# Patient Record
Sex: Female | Born: 2000 | Race: Black or African American | Hispanic: No | Marital: Single | Smoking: Never smoker
Health system: Southern US, Community
[De-identification: ages and names within clinical notes are randomized; demographics above are authoritative.]

## PROBLEM LIST (undated history)

## (undated) ENCOUNTER — Emergency Department (HOSPITAL_COMMUNITY): Admission: EM | Payer: Medicaid Other | Source: Home / Self Care

## (undated) DIAGNOSIS — F32A Depression, unspecified: Secondary | ICD-10-CM

## (undated) DIAGNOSIS — F329 Major depressive disorder, single episode, unspecified: Secondary | ICD-10-CM

## (undated) DIAGNOSIS — N390 Urinary tract infection, site not specified: Secondary | ICD-10-CM

## (undated) DIAGNOSIS — F909 Attention-deficit hyperactivity disorder, unspecified type: Secondary | ICD-10-CM

## (undated) DIAGNOSIS — E669 Obesity, unspecified: Secondary | ICD-10-CM

## (undated) DIAGNOSIS — H539 Unspecified visual disturbance: Secondary | ICD-10-CM

## (undated) DIAGNOSIS — R519 Headache, unspecified: Secondary | ICD-10-CM

## (undated) DIAGNOSIS — T7840XA Allergy, unspecified, initial encounter: Secondary | ICD-10-CM

## (undated) DIAGNOSIS — R51 Headache: Secondary | ICD-10-CM

## (undated) DIAGNOSIS — F419 Anxiety disorder, unspecified: Secondary | ICD-10-CM

---
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
## 2019-08-07 ENCOUNTER — Other Ambulatory Visit: Payer: Self-pay

## 2019-08-07 ENCOUNTER — Encounter (HOSPITAL_COMMUNITY): Payer: Self-pay

## 2019-08-07 ENCOUNTER — Emergency Department (HOSPITAL_COMMUNITY)
Admission: EM | Admit: 2019-08-07 | Discharge: 2019-08-07 | Disposition: A | Discharge status: Home / Self Care | Payer: Medicaid Other | Attending: Emergency Medicine | Admitting: Emergency Medicine

## 2019-08-07 DIAGNOSIS — L0591 Pilonidal cyst without abscess: Secondary | ICD-10-CM | POA: Diagnosis not present

## 2019-08-07 DIAGNOSIS — M533 Sacrococcygeal disorders, not elsewhere classified: Secondary | ICD-10-CM | POA: Diagnosis present

## 2019-08-07 NOTE — Discharge Instructions (Addendum)
Use circular pillows to take the weight off your tailbone.  Use Tylenol and ibuprofen as needed for pain. Follow-up with general surgeon for further treatment options.  Return for fevers, spreading redness or new concerns.

## 2019-08-07 NOTE — ED Provider Notes (Signed)
[facility] EMERGENCY DEPARTMENT Provider Note   CSN: [id number] Arrival date & time: 08/07/19  1342     History Chief Complaint  Patient presents with  . Tailbone Pain    Melissa Reilly is a 19 y.o. female.  Patient with history of depression and anxiety presents with upper buttocks pain in the middle from most 1 week.  No history of similar.  No history of abscesses.  No fevers chills abdominal pain or vomiting.  No injuries.        Past Medical History:  Diagnosis Date  . ADHD (attention deficit hyperactivity disorder)   . Allergy   . Anxiety   . Depression   . Headache   . Obesity   . Urinary tract infection   . Vision abnormalities    wears glasses    Patient Active Problem List   Diagnosis Date Noted  . 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 Suicide attempt by hanging (HCC)   . MDD (major depressive disorder), recurrent severe, without psychosis (HCC) 06/06/2016  . Suicidal ideation 06/06/2016  . MDD (major depressive disorder) 06/05/2016    History reviewed. No pertinent surgical history.   OB History    Gravida  1   Para      Term      Preterm      AB      Living        SAB      TAB      Ectopic      Multiple      Live Births              History reviewed. No pertinent family history.  Social History   Tobacco Use  . Smoking status: Never Smoker  . Smokeless tobacco: Never Used  Vaping Use  . Vaping Use: Never used  Substance Use Topics  . Alcohol use: No  . Drug use: Yes    Types: Marijuana    Home Medications Prior to Admission medications   Medication Sig Start Date End Date Taking? Authorizing Provider  ARIPiprazole (ABILIFY) 15 MG tablet Take 1 tablet (15 mg total) by mouth at bedtime. Patient not taking: Reported on 10/01/2018 12/17/16   Denzil Magnuson, NP  sertraline (ZOLOFT) 100 MG tablet Take 1 tablet (100 mg total) by mouth daily. Patient  not taking: Reported on 10/01/2018 12/18/16   Denzil Magnuson, NP    Allergies    Other, Soap, and Risperidone and related  Review of Systems   Review of Systems  Constitutional: Negative for chills and fever.  Cardiovascular: Negative for chest pain.  Gastrointestinal: Negative for abdominal pain and vomiting.  Genitourinary: Negative for dysuria and flank pain.  Musculoskeletal: Negative for back pain, neck pain and neck stiffness.  Skin: Negative for rash.  Neurological: Negative for weakness, light-headedness, numbness and headaches.    Physical Exam Updated Vital Signs BP 111/77   Pulse 85   Temp 98.3 F (36.8 C) (Oral)   Resp 16   Wt 79.8 kg   SpO2 99%   Physical Exam Vitals and nursing note reviewed.  Constitutional:      Appearance: She is well-developed.  HENT:     Head: Normocephalic and atraumatic.  Eyes:     General:        Right eye: No discharge.        Left eye: No discharge.  Neck:     Trachea: No tracheal deviation.  Cardiovascular:     Rate and Rhythm: Normal rate.  Pulmonary:     Effort: Pulmonary effort is normal.  Abdominal:     General: There is no distension.     Palpations: Abdomen is soft.  Musculoskeletal:        General: Tenderness present.     Cervical back: Normal range of motion.  Skin:    General: Skin is warm.     Findings: No rash.     Comments: Patient has tenderness midline upper gluteal cleft, no external signs of infection, no erythema, no warmth, no edema, mild tenderness to palpation midline.  No fluctuance gluteal region.  Neurological:     Mental Status: She is alert and oriented to person, place, and time.     ED Results / Procedures / Treatments   Labs (all labs ordered are listed, but only abnormal results are displayed) Labs Reviewed - No data to display  EKG None  Radiology No results found.  Procedures Procedures (including critical care time)  Medications Ordered in ED Medications - No data to  display  ED Course  I have reviewed the triage vital signs and the nursing notes.  Pertinent labs & imaging results that were available during my care of the patient were reviewed by me and considered in my medical decision making (see chart for details).    MDM Rules/Calculators/A&P                          Patient presents with clinical concern for pilonidal cyst.  Mild tenderness on exam.  No external signs of infection no fever, well-appearing otherwise.  Discussed supportive care and follow-up with general surgery.  Final Clinical Impression(s) / ED Diagnoses Final diagnoses:  Pilonidal cyst    Rx / DC Orders ED Discharge Orders    None       Blane Ohara, MD 08/07/19 1714

## 2019-08-07 NOTE — ED Triage Notes (Signed)
Pt presents to ED via [facility] with tailbone pain x1 week. Pt denies any injury

## 2019-08-17 ENCOUNTER — Emergency Department (HOSPITAL_COMMUNITY)
Admission: EM | Admit: 2019-08-17 | Discharge: 2019-08-18 | Disposition: A | Discharge status: Home / Self Care | Payer: Medicaid Other | Attending: Emergency Medicine | Admitting: Emergency Medicine

## 2019-08-17 ENCOUNTER — Other Ambulatory Visit: Payer: Self-pay

## 2019-08-17 ENCOUNTER — Encounter (HOSPITAL_COMMUNITY): Payer: Self-pay | Admitting: Emergency Medicine

## 2019-08-17 DIAGNOSIS — Z5321 Procedure and treatment not carried out due to patient leaving prior to being seen by health care provider: Secondary | ICD-10-CM | POA: Insufficient documentation

## 2019-08-17 DIAGNOSIS — R0981 Nasal congestion: Secondary | ICD-10-CM | POA: Diagnosis present

## 2019-08-17 DIAGNOSIS — J0100 Acute maxillary sinusitis, unspecified: Secondary | ICD-10-CM | POA: Insufficient documentation

## 2019-08-17 NOTE — ED Triage Notes (Signed)
Patient arrived with EMS from home reports nasal congestion/maxillary sinus pressure , denies cough , no fever or chills .

## 2019-08-18 NOTE — ED Notes (Signed)
Pt told the risk of leaving. Pt said the wait was too long. Pt walk out cussing.

## 2020-03-31 ENCOUNTER — Encounter (HOSPITAL_COMMUNITY): Payer: Self-pay | Admitting: Emergency Medicine

## 2020-03-31 ENCOUNTER — Emergency Department (HOSPITAL_COMMUNITY)
Admission: EM | Admit: 2020-03-31 | Discharge: 2020-03-31 | Disposition: A | Discharge status: Home / Self Care | Payer: Medicaid Other | Attending: Emergency Medicine | Admitting: Emergency Medicine

## 2020-03-31 DIAGNOSIS — F419 Anxiety disorder, unspecified: Secondary | ICD-10-CM | POA: Diagnosis not present

## 2020-03-31 DIAGNOSIS — Z23 Encounter for immunization: Secondary | ICD-10-CM | POA: Insufficient documentation

## 2020-03-31 DIAGNOSIS — S61412A Laceration without foreign body of left hand, initial encounter: Secondary | ICD-10-CM | POA: Diagnosis not present

## 2020-03-31 DIAGNOSIS — Z20822 Contact with and (suspected) exposure to covid-19: Secondary | ICD-10-CM | POA: Diagnosis not present

## 2020-03-31 DIAGNOSIS — S61411A Laceration without foreign body of right hand, initial encounter: Secondary | ICD-10-CM

## 2020-03-31 DIAGNOSIS — X789XXA Intentional self-harm by unspecified sharp object, initial encounter: Secondary | ICD-10-CM | POA: Diagnosis not present

## 2020-03-31 DIAGNOSIS — R4689 Other symptoms and signs involving appearance and behavior: Secondary | ICD-10-CM

## 2020-03-31 DIAGNOSIS — S6992XA Unspecified injury of left wrist, hand and finger(s), initial encounter: Secondary | ICD-10-CM | POA: Diagnosis present

## 2020-03-31 LAB — CBC WITH DIFFERENTIAL/PLATELET
Abs Immature Granulocytes: 0.02 10*3/uL (ref 0.00–0.07)
Basophils Absolute: 0 10*3/uL (ref 0.0–0.1)
Basophils Relative: 0 %
Eosinophils Absolute: 0 10*3/uL (ref 0.0–0.5)
Eosinophils Relative: 0 %
HCT: 39.3 % (ref 36.0–46.0)
Hemoglobin: 12.6 g/dL (ref 12.0–15.0)
Immature Granulocytes: 0 %
Lymphocytes Relative: 37 %
Lymphs Abs: 2.6 10*3/uL (ref 0.7–4.0)
MCH: 28.8 pg (ref 26.0–34.0)
MCHC: 32.1 g/dL (ref 30.0–36.0)
MCV: 89.9 fL (ref 80.0–100.0)
Monocytes Absolute: 0.2 10*3/uL (ref 0.1–1.0)
Monocytes Relative: 3 %
Neutro Abs: 4.2 10*3/uL (ref 1.7–7.7)
Neutrophils Relative %: 60 %
Platelets: 285 10*3/uL (ref 150–400)
RBC: 4.37 MIL/uL (ref 3.87–5.11)
RDW: 13.9 % (ref 11.5–15.5)
WBC: 7.1 10*3/uL (ref 4.0–10.5)
nRBC: 0 % (ref 0.0–0.2)

## 2020-03-31 LAB — BASIC METABOLIC PANEL
Anion gap: 10 (ref 5–15)
BUN: 13 mg/dL (ref 6–20)
CO2: 23 mmol/L (ref 22–32)
Calcium: 10 mg/dL (ref 8.9–10.3)
Chloride: 108 mmol/L (ref 98–111)
Creatinine, Ser: 0.95 mg/dL (ref 0.44–1.00)
GFR, Estimated: >60 mL/min (ref >60)
Glucose, Bld: 79 mg/dL (ref 70–99)
Potassium: 4.1 mmol/L (ref 3.5–5.1)
Sodium: 141 mmol/L (ref 135–145)

## 2020-03-31 LAB — I-STAT BETA HCG BLOOD, ED (MC, WL, AP ONLY): I-stat hCG, quantitative: <5 m[IU]/mL (ref <5)

## 2020-03-31 LAB — RESP PANEL BY RT-PCR (FLU A&B, COVID) ARPGX2
Influenza A by PCR: NEGATIVE
Influenza B by PCR: NEGATIVE
SARS Coronavirus 2 by RT PCR: NEGATIVE

## 2020-03-31 MED ORDER — TETANUS-DIPHTH-ACELL PERTUSSIS 5-2.5-18.5 LF-MCG/0.5 IM SUSY
0.5000 mL | PREFILLED_SYRINGE | Freq: Once | INTRAMUSCULAR | Status: AC
  Administered 2020-03-31: 0.5 mL via INTRAMUSCULAR
  Filled 2020-03-31: qty 0.5

## 2020-03-31 NOTE — BH Assessment (Signed)
Disposition [name], NP, patient does not meet inpatient criteria. Patient contracted for safety. Patient will follow up with [facility]. TTS Clinician faxed additional outpatient therapy resources to patient. Lamar Laundry, RN, informed of disposition.

## 2020-03-31 NOTE — ED Triage Notes (Signed)
Per Felipa Emory with [facility] social services-states patient called him due self inflicted cuts-patient states she is angry, frustrated and just wants to go home-patient smashed a mirror in her apartment-denies SI/HI

## 2020-03-31 NOTE — ED Notes (Addendum)
Per pts request. Phone watch charger and wallet was given to pts boyfriend.  pts boyfriend is Orthoptist # [phone]

## 2020-03-31 NOTE — ED Notes (Signed)
Pt belonging located in locker 27

## 2020-03-31 NOTE — ED Provider Notes (Addendum)
[facility]-EMERGENCY DEPT Provider Note   CSN: [id number] Arrival date & time: 03/31/20  1623     History Chief Complaint  Patient presents with  . Psychiatric Evaluation    Brette A Helvie is a 20 y.o. female.  20 yo F with a chief complaints of intentional self injury.  This was assessed by [facility] social services who were worried for her safety.  Police were called and she was escorted here for evaluation.  Patient is upset at home does not feel like she needs to be here.  She denies SI or HI denies hallucinations.  She tells me she is just very angry and when lashed out and broke a mirror in the process.  Denies any other prior destructive behavior.  She has a cut to her right and left thumb and to her right forearm.  She is unsure of her last tetanus.  The history is provided by the patient.  Illness Severity:  Moderate Onset quality:  Gradual Duration:  1 day Timing:  Constant Progression:  Unchanged Chronicity:  New Associated symptoms: no chest pain, no congestion, no fever, no headaches, no myalgias, no nausea, no rhinorrhea, no shortness of breath, no vomiting and no wheezing        Past Medical History:  Diagnosis Date  . ADHD (attention deficit hyperactivity disorder)   . Allergy   . Anxiety   . Depression   . Headache   . Obesity   . Urinary tract infection   . Vision abnormalities    wears glasses    Patient Active Problem List   Diagnosis Date Noted  . 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 Suicide attempt by hanging (HCC)   . MDD (major depressive disorder), recurrent severe, without psychosis (HCC) 06/06/2016  . Suicidal ideation 06/06/2016  . MDD (major depressive disorder) 06/05/2016    History reviewed. No pertinent surgical history.   OB History    Gravida  1   Para      Term      Preterm      AB      Living        SAB      IAB      Ectopic       Multiple      Live Births              No family history on file.  Social History   Tobacco Use  . Smoking status: Never Smoker  . Smokeless tobacco: Never Used  Vaping Use  . Vaping Use: Never used  Substance Use Topics  . Alcohol use: No  . Drug use: Yes    Types: Marijuana    Home Medications Prior to Admission medications   Medication Sig Start Date End Date Taking? Authorizing Provider  ARIPiprazole (ABILIFY) 15 MG tablet Take 1 tablet (15 mg total) by mouth at bedtime. Patient not taking: Reported on 10/01/2018 12/17/16   Denzil Magnuson, NP  sertraline (ZOLOFT) 100 MG tablet Take 1 tablet (100 mg total) by mouth daily. Patient not taking: Reported on 10/01/2018 12/18/16   Denzil Magnuson, NP    Allergies    Other, Soap, and Risperidone and related  Review of Systems   Review of Systems  Constitutional: Negative for chills and fever.  HENT: Negative for congestion and rhinorrhea.   Eyes: Negative for redness and visual disturbance.  Respiratory: Negative for shortness of breath and wheezing.  Cardiovascular: Negative for chest pain and palpitations.  Gastrointestinal: Negative for nausea and vomiting.  Genitourinary: Negative for dysuria and urgency.  Musculoskeletal: Negative for arthralgias and myalgias.  Skin: Positive for wound. Negative for pallor.  Neurological: Negative for dizziness and headaches.  Psychiatric/Behavioral: Positive for self-injury.    Physical Exam Updated Vital Signs BP 126/73 (BP Location: Left Arm)   Pulse 94   Temp 98.8 F (37.1 C) (Oral)   Resp 18   SpO2 99%   Physical Exam Vitals and nursing note reviewed.  Constitutional:      General: She is not in acute distress.    Appearance: She is well-developed. She is not diaphoretic.  HENT:     Head: Normocephalic and atraumatic.  Eyes:     Pupils: Pupils are equal, round, and reactive to light.  Cardiovascular:     Rate and Rhythm: Normal rate and regular rhythm.      Heart sounds: No murmur heard. No friction rub. No gallop.   Pulmonary:     Effort: Pulmonary effort is normal.     Breath sounds: No wheezing or rales.  Abdominal:     General: There is no distension.     Palpations: Abdomen is soft.     Tenderness: There is no abdominal tenderness.  Musculoskeletal:        General: No tenderness.       Arms:       Hands:     Cervical back: Normal range of motion and neck supple.  Skin:    General: Skin is warm and dry.  Neurological:     Mental Status: She is alert and oriented to person, place, and time.  Psychiatric:        Behavior: Behavior normal.     ED Results / Procedures / Treatments   Labs (all labs ordered are listed, but only abnormal results are displayed) Labs Reviewed  RESP PANEL BY RT-PCR (FLU A&B, COVID) ARPGX2  CBC WITH DIFFERENTIAL/PLATELET  BASIC METABOLIC PANEL  I-STAT BETA HCG BLOOD, ED ([facility], [facility], [facility] ONLY)    EKG None  Radiology No results found.  Procedures Procedures   Medications Ordered in ED Medications  Tdap (BOOSTRIX) injection 0.5 mL (0.5 mLs Intramuscular Given 03/31/20 2006)    ED Course  I have reviewed the triage vital signs and the nursing notes.  Pertinent labs & imaging results that were available during my care of the patient were reviewed by me and considered in my medical decision making (see chart for details).    MDM Rules/Calculators/A&P                          20 yo F with a increased anger issues at home causing her to break a mirror.  She was brought here by a Child psychotherapist with concern for self harm.  Will obtain a TTS evaluation.  Medically clear.   TTS eval feel patient safe for D/c.   The patients results and plan were reviewed and discussed.   Any x-rays performed were independently reviewed by myself.   Differential diagnosis were considered with the presenting HPI.  Medications  Tdap (BOOSTRIX) injection 0.5 mL (0.5 mLs Intramuscular Given 03/31/20 2006)     Vitals:   03/31/20 1649  BP: 126/73  Pulse: 94  Resp: 18  Temp: 98.8 F (37.1 C)  TempSrc: Oral  SpO2: 99%    Final diagnoses:  Aggressive behavior  Laceration of right hand without  foreign body, initial encounter     Final Clinical Impression(s) / ED Diagnoses Final diagnoses:  Aggressive behavior  Laceration of right hand without foreign body, initial encounter    Rx / DC Orders ED Discharge Orders    None       Melene Plan, DO 03/31/20 2208    Melene Plan, DO 03/31/20 2239
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
## 2020-04-01 DIAGNOSIS — F419 Anxiety disorder, unspecified: Secondary | ICD-10-CM | POA: Insufficient documentation

## 2020-06-21 ENCOUNTER — Other Ambulatory Visit: Payer: Self-pay
